# Patient Record
Sex: Female | Born: 1965 | Race: White | Hispanic: No | Marital: Married | Smoking: Never smoker
Health system: Southern US, Community
[De-identification: ages and names within clinical notes are randomized; demographics above are authoritative.]

## PROBLEM LIST (undated history)

## (undated) DIAGNOSIS — F329 Major depressive disorder, single episode, unspecified: Secondary | ICD-10-CM

## (undated) DIAGNOSIS — D510 Vitamin B12 deficiency anemia due to intrinsic factor deficiency: Secondary | ICD-10-CM

## (undated) DIAGNOSIS — D649 Anemia, unspecified: Secondary | ICD-10-CM

## (undated) DIAGNOSIS — F419 Anxiety disorder, unspecified: Secondary | ICD-10-CM

## (undated) DIAGNOSIS — F32A Depression, unspecified: Secondary | ICD-10-CM

## (undated) DIAGNOSIS — E785 Hyperlipidemia, unspecified: Secondary | ICD-10-CM

## (undated) DIAGNOSIS — F5104 Psychophysiologic insomnia: Secondary | ICD-10-CM

## (undated) DIAGNOSIS — N159 Renal tubulo-interstitial disease, unspecified: Secondary | ICD-10-CM

## (undated) HISTORY — DX: Hyperlipidemia, unspecified: E78.5

## (undated) HISTORY — DX: Anemia, unspecified: D64.9

## (undated) HISTORY — DX: Depression, unspecified: F32.A

## (undated) HISTORY — DX: Major depressive disorder, single episode, unspecified: F32.9

## (undated) HISTORY — DX: Renal tubulo-interstitial disease, unspecified: N15.9

## (undated) HISTORY — DX: Psychophysiologic insomnia: F51.04

## (undated) HISTORY — PX: WISDOM TOOTH EXTRACTION: SHX21

## (undated) HISTORY — DX: Anxiety disorder, unspecified: F41.9

## (undated) HISTORY — DX: Vitamin B12 deficiency anemia due to intrinsic factor deficiency: D51.0

---

## 1998-08-23 HISTORY — PX: AUGMENTATION MAMMAPLASTY: SUR837

## 2005-01-08 ENCOUNTER — Ambulatory Visit: Payer: Self-pay | Admitting: Unknown Physician Specialty

## 2006-03-08 ENCOUNTER — Ambulatory Visit: Payer: Self-pay | Admitting: Unknown Physician Specialty

## 2006-04-09 ENCOUNTER — Ambulatory Visit: Payer: Self-pay | Admitting: Family Medicine

## 2007-05-26 ENCOUNTER — Ambulatory Visit: Payer: Self-pay | Admitting: Unknown Physician Specialty

## 2008-05-27 ENCOUNTER — Ambulatory Visit: Payer: Self-pay | Admitting: Unknown Physician Specialty

## 2009-01-21 HISTORY — PX: THYROGLOSSAL DUCT CYST: SHX297

## 2009-02-12 ENCOUNTER — Emergency Department: Payer: Self-pay | Admitting: Emergency Medicine

## 2009-02-12 ENCOUNTER — Ambulatory Visit: Payer: Self-pay | Admitting: Otolaryngology

## 2009-02-13 ENCOUNTER — Ambulatory Visit: Payer: Self-pay | Admitting: Otolaryngology

## 2009-02-19 ENCOUNTER — Ambulatory Visit: Payer: Self-pay | Admitting: Otolaryngology

## 2009-05-30 ENCOUNTER — Ambulatory Visit: Payer: Self-pay | Admitting: Unknown Physician Specialty

## 2010-06-18 ENCOUNTER — Ambulatory Visit: Payer: Self-pay | Admitting: Unknown Physician Specialty

## 2010-11-03 ENCOUNTER — Emergency Department: Payer: Self-pay | Admitting: Emergency Medicine

## 2010-11-11 ENCOUNTER — Ambulatory Visit: Payer: Self-pay | Admitting: Internal Medicine

## 2011-07-20 ENCOUNTER — Ambulatory Visit: Payer: Self-pay | Admitting: Unknown Physician Specialty

## 2012-02-20 IMAGING — CR DG CHEST 2V
1 series · 2 of 2 positions shown · non-contrast
Comparison: none

REASON FOR EXAM: cp cough sob
COMMENTS:

[Series 1: view not recorded · 0.17mm/px · 2 of 2 slices shown]
[im 1/2]
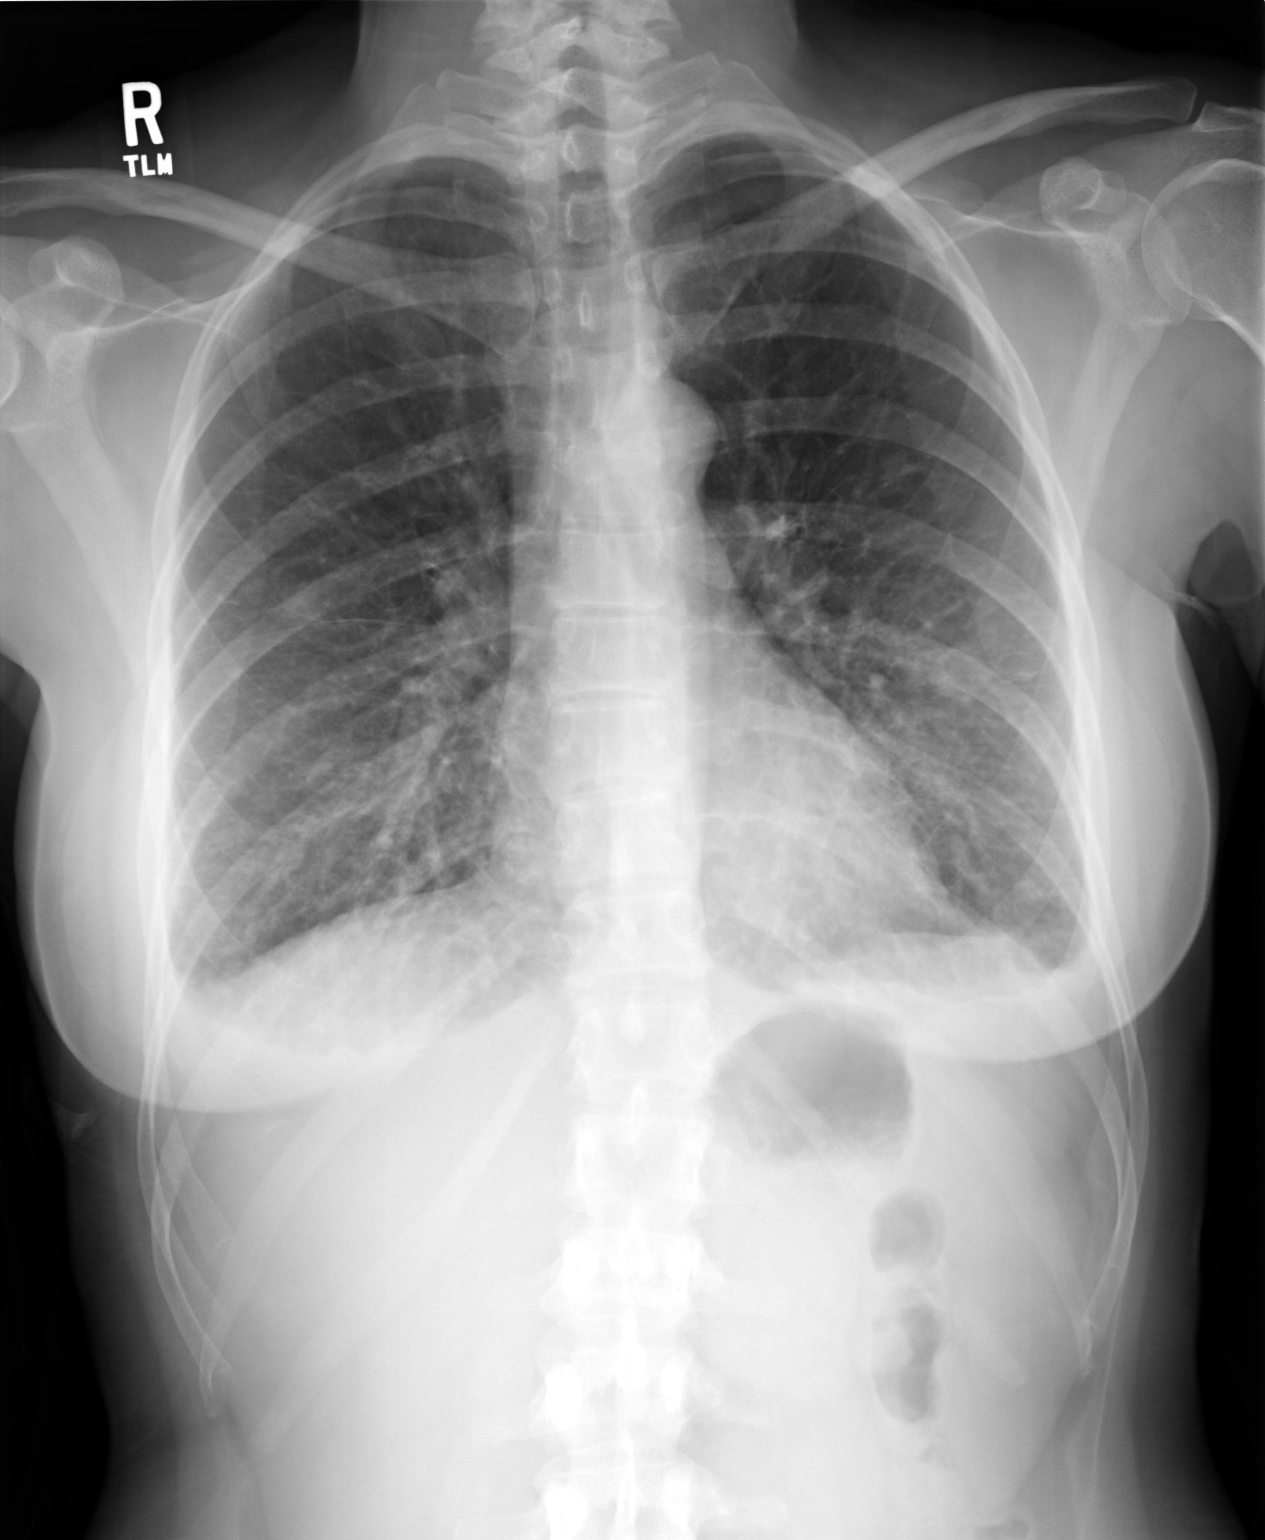
[im 2/2]
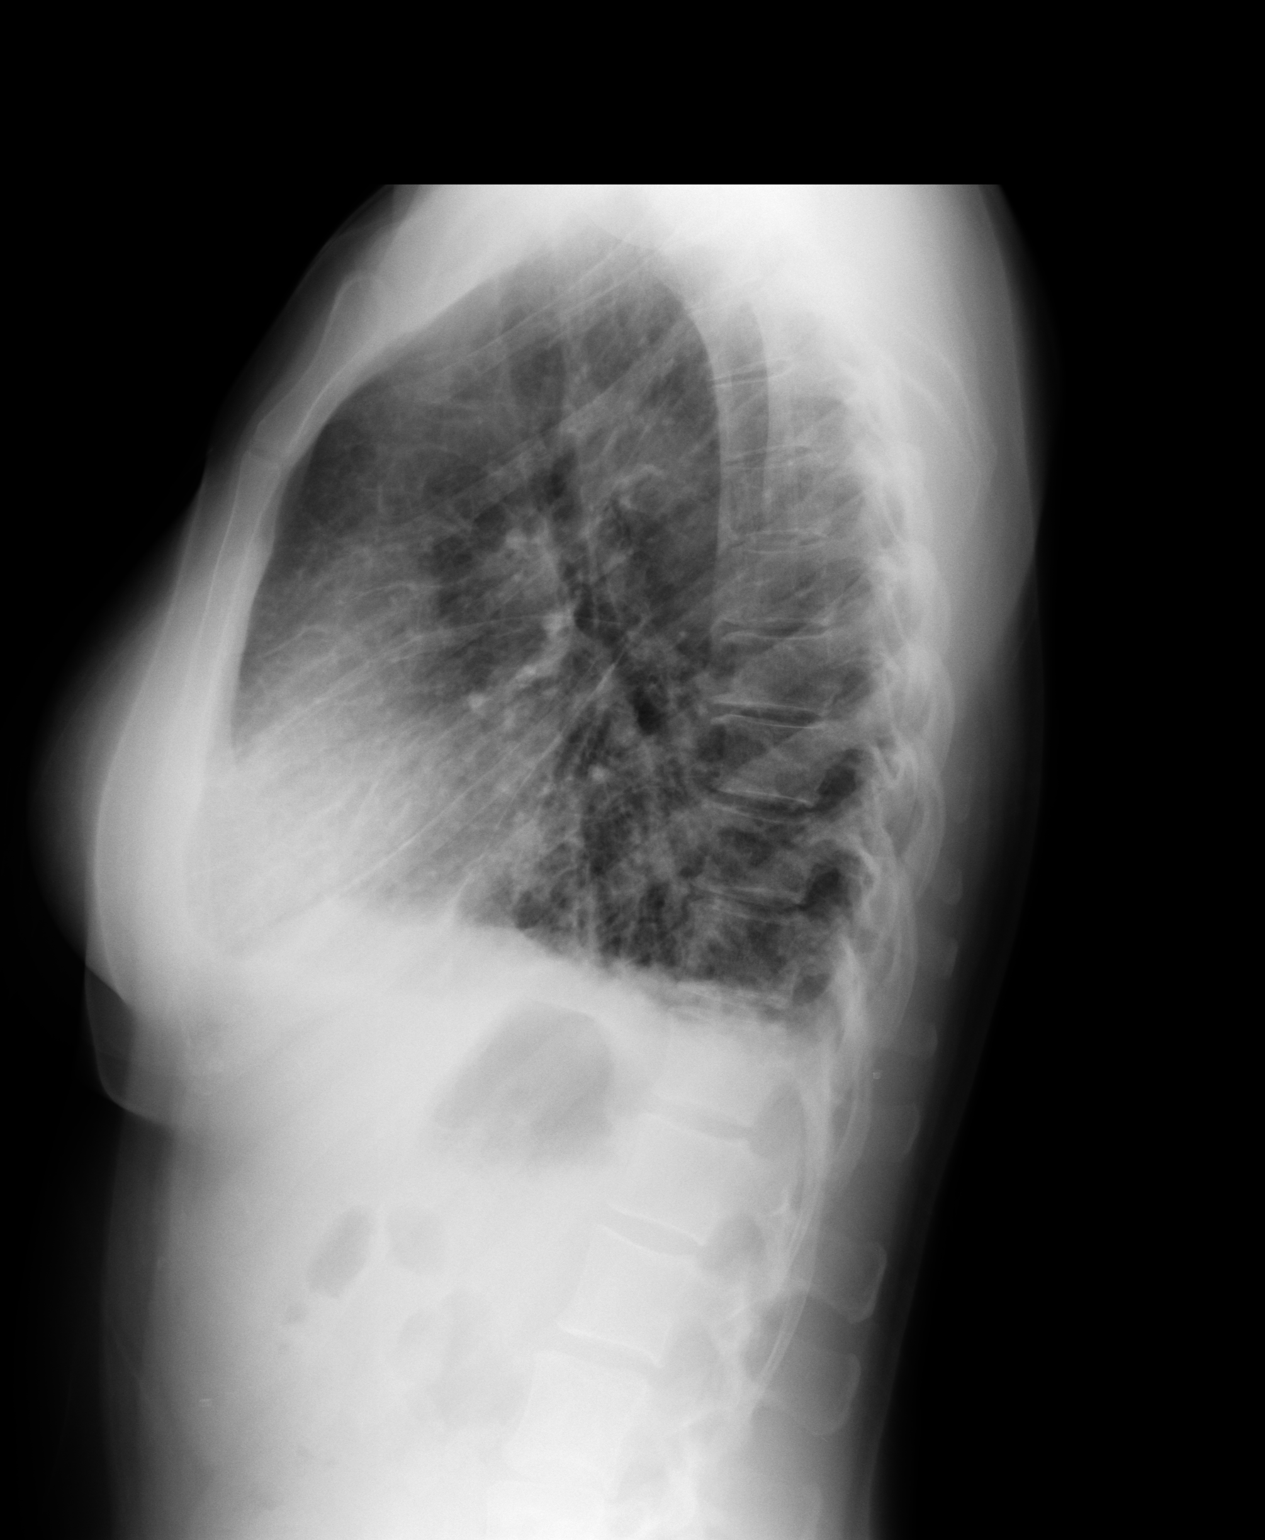

[2 of 2 positions shown; findings below may reference images not displayed]

PROCEDURE:     DXR - DXR CHEST PA (OR AP) AND LATERAL  - November 03, 2010  [DATE]

RESULT:     There is blunting of both costophrenic angles compatible with
trace bibasilar pleural effusions. There is thickening of the basilar lung
markings bilaterally compatible with either interstitial edema or
atelectasis. No pneumonia is identified. Heart size is within normal limits.
The chest appears mildly hyperexpanded which suggest a history of COPD or
asthma.
IMPRESSION: 1. There are noted minimal changes suggestive of early or low grade CHF
superimposed on COPD. No definite pneumonia is seen.
2. Small bibasilar pleural effusions are noted.

## 2012-02-28 IMAGING — CT CT CHEST W/ CM
1 series · 15 of 32 positions shown, 19 images · IV contrast (APPLIED)
Comparison: none

REASON FOR EXAM: chest pain abn xray vq scan elevated d dimer  allergy
COMMENTS:

[Series 4: soft tissue · axial · 0.57mm/px · z∈[-760,-496]mm · 15 of 99 slices shown, 19 images]
[im 7/99  soft-tissue]
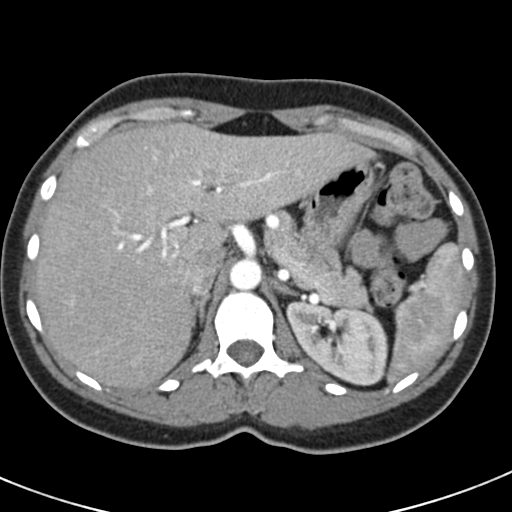
[im 7/99  bone]
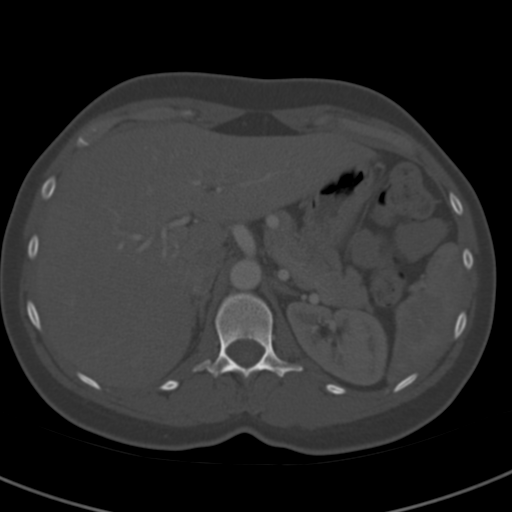
[im 13/99  soft-tissue]
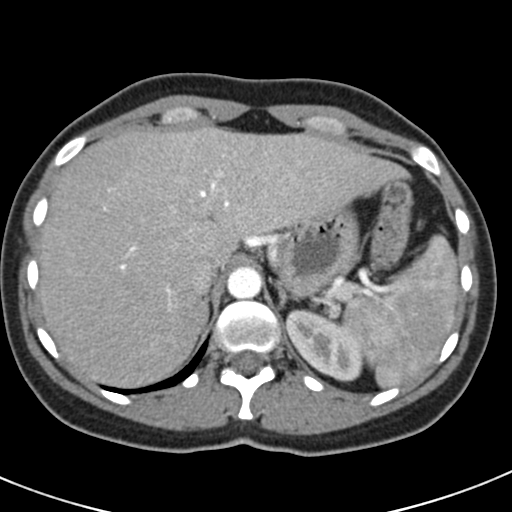
[im 19/99  soft-tissue]
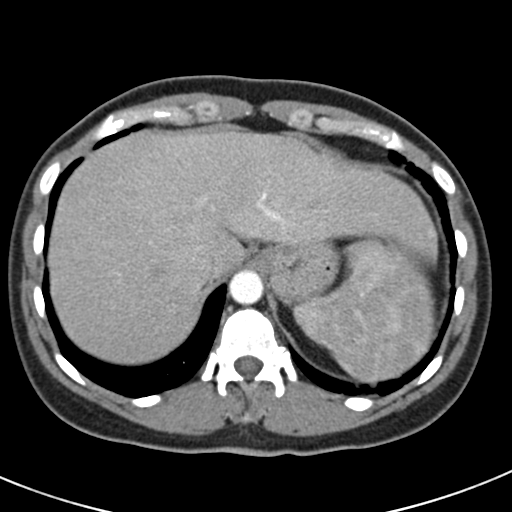
[im 29/99  soft-tissue]
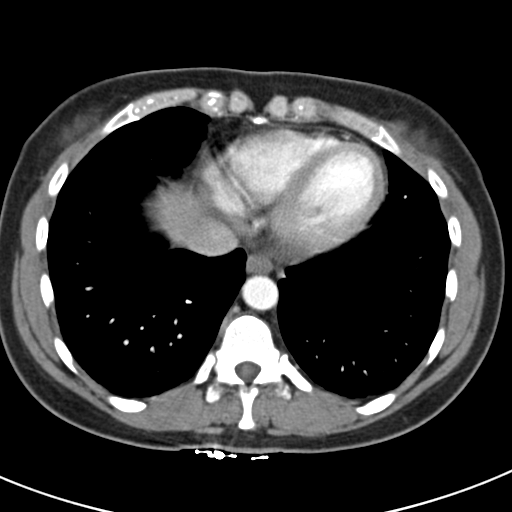
[im 35/99  soft-tissue]
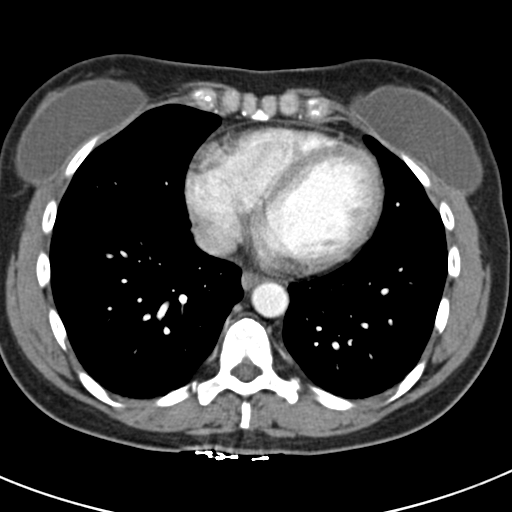
[im 42/99  soft-tissue]
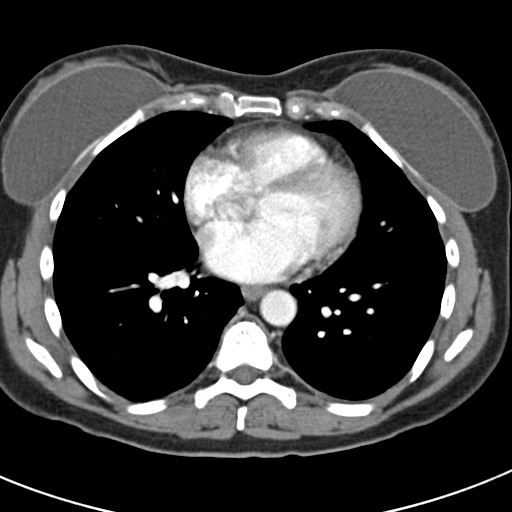
[im 51/99  soft-tissue]
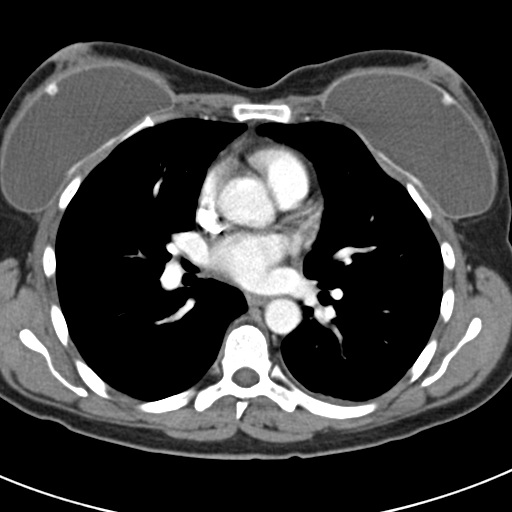
[im 57/99  soft-tissue]
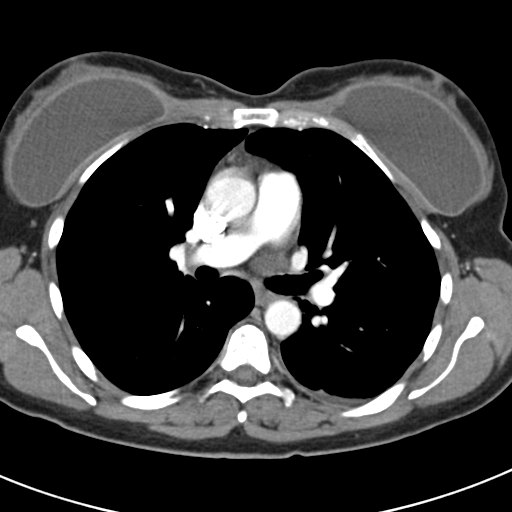
[im 64/99  soft-tissue]
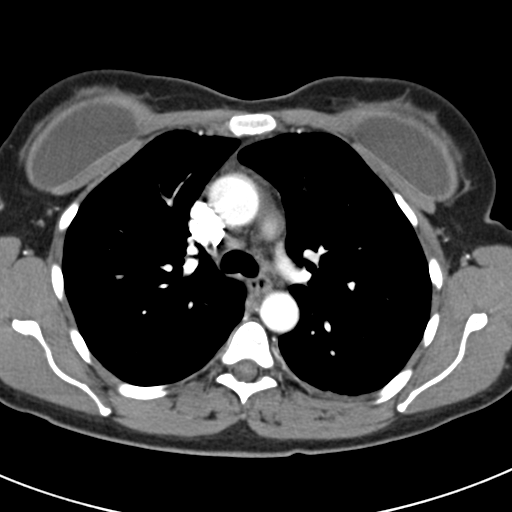
[im 64/99  bone]
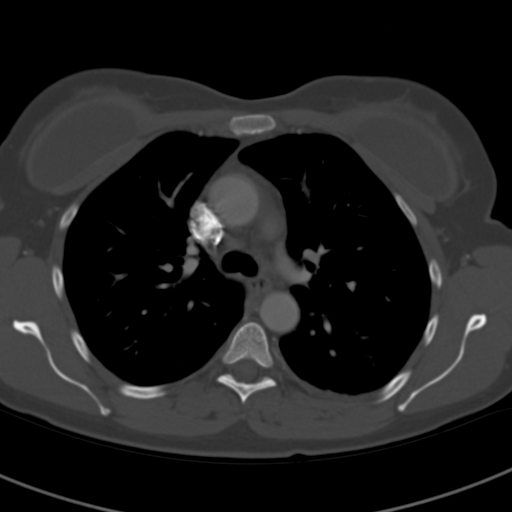
[im 70/99  soft-tissue]
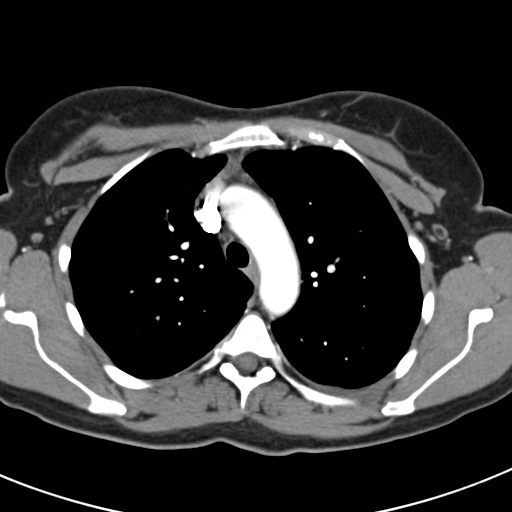
[im 80/99  soft-tissue]
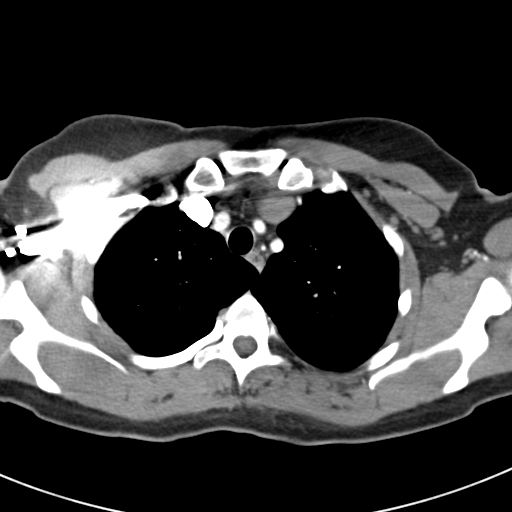
[im 86/99  soft-tissue]
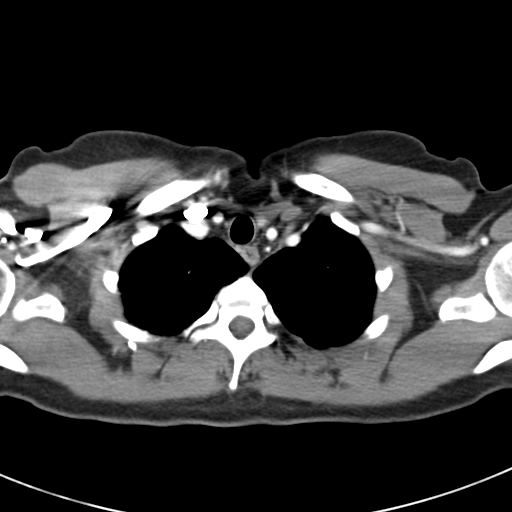
[im 86/99  lung]
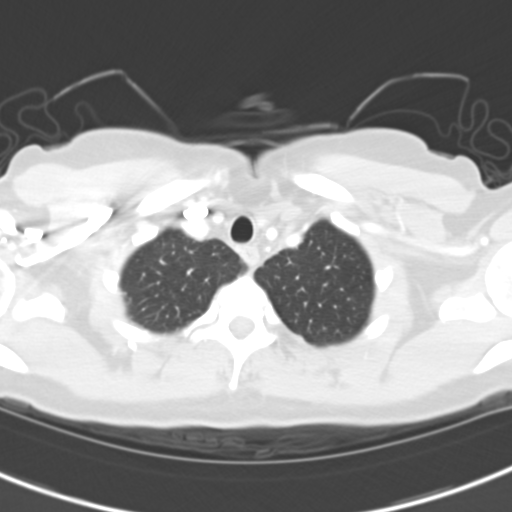
[im 89/99  lung]
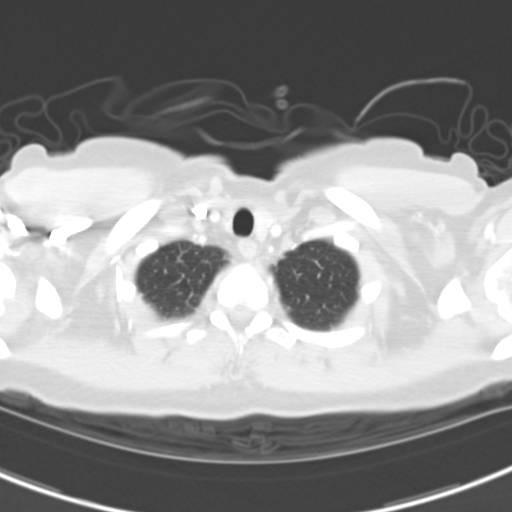
[im 92/99  soft-tissue]
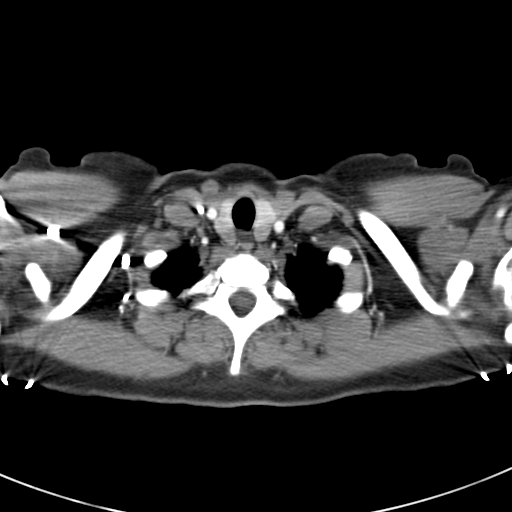
[im 92/99  lung]
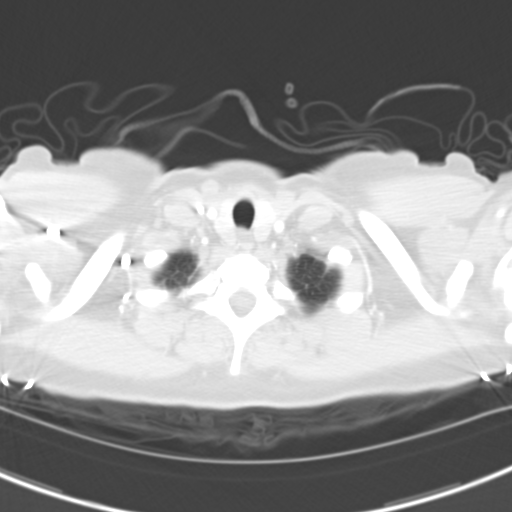
[im 95/99  lung]
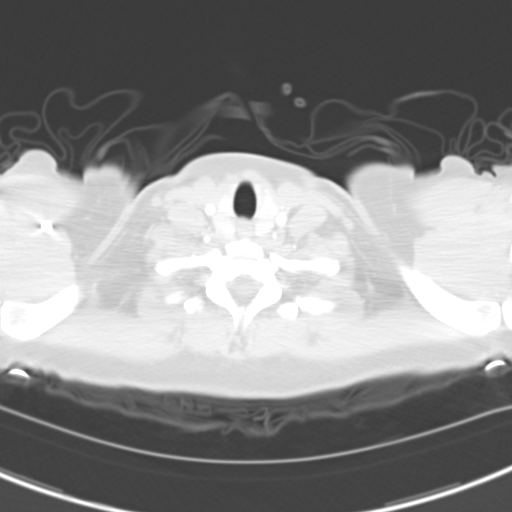

[15 of 32 positions shown; findings below may reference images not displayed]

PROCEDURE:     CT  - CT CHEST WITH CONTRAST  - November 11, 2010  [DATE]

RESULT:     Axial CT scanning was performed through the chest at 3 mm
intervals and slice thicknesses. The patient received 75 cc of [id number]
after undergoing a descensus patient protocol.

The cardiac chambers are normal in size. The caliber of the thoracic aorta
is normal. Contrast within the pulmonary arterial tree is normal in
appearance. I see no filling defects to suggest an acute pulmonary embolism.
There is no pleural nor pericardial effusion. There are bilateral breast
implants in place.

At lung window settings I see no to evidence of pneumonia. There is minimal
atelectasis in the costophrenic gutters on the left. No suspicious pulmonary
parenchymal masses are demonstrated. The thoracic vertebral bodies are
preserved in height. The sternum is normal in appearance. I do not see
evidence of an acute rib lesion.

Within the upper abdomen the observed portions of the liver and spleen
exhibit no acute abnormality. There are no adrenal masses.
IMPRESSION: 1. I do not see evidence of an acute pulmonary embolism.
2. There is no evidence of CHF nor pneumonia. I see no mediastinal nor hilar
mass.
3. The lungs appeared abnormal on the prior chest x-ray 03 November, 2010. On
today's study there is mild emphysematous type change, but I do not see
evidence of interstitial or alveolar infiltrates.

## 2013-09-06 DIAGNOSIS — D510 Vitamin B12 deficiency anemia due to intrinsic factor deficiency: Secondary | ICD-10-CM | POA: Insufficient documentation

## 2013-12-28 DIAGNOSIS — G4700 Insomnia, unspecified: Secondary | ICD-10-CM | POA: Insufficient documentation

## 2013-12-28 DIAGNOSIS — J302 Other seasonal allergic rhinitis: Secondary | ICD-10-CM | POA: Insufficient documentation

## 2013-12-28 DIAGNOSIS — E785 Hyperlipidemia, unspecified: Secondary | ICD-10-CM | POA: Insufficient documentation

## 2013-12-28 DIAGNOSIS — F419 Anxiety disorder, unspecified: Secondary | ICD-10-CM | POA: Insufficient documentation

## 2014-01-01 DIAGNOSIS — M1990 Unspecified osteoarthritis, unspecified site: Secondary | ICD-10-CM | POA: Insufficient documentation

## 2014-05-08 ENCOUNTER — Ambulatory Visit (INDEPENDENT_AMBULATORY_CARE_PROVIDER_SITE_OTHER): Payer: 59 | Admitting: Podiatry

## 2014-05-08 ENCOUNTER — Ambulatory Visit (INDEPENDENT_AMBULATORY_CARE_PROVIDER_SITE_OTHER): Payer: 59

## 2014-05-08 ENCOUNTER — Encounter: Payer: Self-pay | Admitting: Podiatry

## 2014-05-08 VITALS — BP 133/76 | HR 85 | Resp 16 | Ht 64.0 in | Wt 150.0 lb

## 2014-05-08 DIAGNOSIS — M779 Enthesopathy, unspecified: Secondary | ICD-10-CM

## 2014-05-08 DIAGNOSIS — M79609 Pain in unspecified limb: Secondary | ICD-10-CM

## 2014-05-08 MED ORDER — METHYLPREDNISOLONE (PAK) 4 MG PO TABS: ORAL_TABLET | ORAL | Status: DC

## 2014-05-08 MED ORDER — MELOXICAM 15 MG PO TABS
15.0000 mg | ORAL_TABLET | Freq: Every day | ORAL | Status: DC
Start: 2014-05-08 — End: 2014-07-03

## 2014-05-08 NOTE — Progress Notes (Signed)
   Subjective:    Patient ID: Heather Holmes, female    DOB: [birth year], 48 y.o.   MRN: [id number]  HPI Comments: Both of my feet have pain. i have pain in  Both of my 2nd toes and pain under my toes. Ive had the pain for 1 year. Its getting worse. At the end of the day im limping. It hurts to walk. i had x-rays done by dr Lavenia Atlas in march and i was diagnosed with osteoarthritis. i went to see dr troxler in may and he said i could have a hammer toe or a neuroma. i take aleve for pain, wrap my toes and that's it.  Foot Pain      Review of Systems  Musculoskeletal:       Joint pain Difficulty walking   All other systems reviewed and are negative.      Objective:   Physical Exam: I have reviewed her past medical history medications allergies surgeries social history and review of systems. Pulses are strongly palpable bilateral. Neurologic sensorium is intact per Semmes-Weinstein monofilament. Deep tendon reflexes are intact bilateral muscle strength is 5 over 5 dorsiflexors plantar flexors inverters everters all intrinsic musculature is intact. Orthopedic evaluation demonstrates all joints distal to the ankle a full range of motion without crepitation. She has pain on palpation and on in range of motion of the second metatarsophalangeal joints bilaterally. Right appears to be worse than left. Radiographic evaluation demonstrates an elongated second metatarsal no osseous abnormalities other than medial deviation of the second toe but no hammertoe deformity.        Assessment & Plan:  Assessment: Capsulitis second metatarsophalangeal joint of the right foot greater than left. Elongated plantarflexed second metatarsal bilateral.  Plan: Discussed etiology pathology conservative versus surgical therapies. Intra-articular injection of dexamethasone and local anesthetic to the second metatarsophalangeal joint today after sterile Betadine skin prep. Prescription for Sterapred Dosepak to be  followed by meloxicam. Dispensed carbon graphite insoles. Discussed appropriate shoe gear stretching exercises ice therapy and shoe gear modifications.

## 2014-06-05 ENCOUNTER — Ambulatory Visit (INDEPENDENT_AMBULATORY_CARE_PROVIDER_SITE_OTHER): Payer: 59 | Admitting: Podiatry

## 2014-06-05 VITALS — BP 122/73 | HR 87 | Resp 16

## 2014-06-05 DIAGNOSIS — M779 Enthesopathy, unspecified: Secondary | ICD-10-CM

## 2014-06-05 NOTE — Progress Notes (Signed)
She presents today stating that she is approximately 50-60% improved as she refers her capsulitis second metatarsophalangeal joint bilateral.  Objective: She presents today wearing high-heeled shoes. She still has mild tenderness on palpation and in range of motion of the second metatarsophalangeal joint bilateral right greater than left.  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  Plan: Discussed appropriate shoe gear stretching exercises ice therapy shoe gear modifications discussed continue her anti-inflammatory and I will followup with her in the near future for surgical evaluation if necessary.

## 2014-07-03 ENCOUNTER — Ambulatory Visit (INDEPENDENT_AMBULATORY_CARE_PROVIDER_SITE_OTHER): Payer: 59 | Admitting: Podiatry

## 2014-07-03 VITALS — BP 144/82 | HR 81 | Resp 16

## 2014-07-03 DIAGNOSIS — M779 Enthesopathy, unspecified: Secondary | ICD-10-CM

## 2014-07-03 MED ORDER — MELOXICAM 15 MG PO TABS: 15.0000 mg | ORAL_TABLET | Freq: Every day | ORAL | Status: DC

## 2014-07-03 NOTE — Progress Notes (Signed)
She presents today for follow-up of capsulitis second metatarsophalangeal joint bilaterally. She states that they're doing about the same there probably 80% improved from where they were initially. She denies any changes in her past medical history medications allergies surgeries or social history. Denies any trauma to the feet. Continues conservative therapies including anti-inflammatories and graphite insoles.  Objective: Vital signs are stable she is alert and oriented 3. Pulses are palpable bilateral. She has much decreased in edema about the second metatarsophalangeal joint bilateral no pain on end range of motion of the second and third metatarsophalangeal joints bilateral.  Assessment: Slowly resolving capsulitis second metatarsophalangeal joint bilateral.  Plan: Continue all conservative therapies and I will follow-up with her in 6 weeks if needed. May discuss orthotics at that time.

## 2014-08-07 ENCOUNTER — Ambulatory Visit: Payer: 59 | Admitting: Podiatry

## 2014-08-28 ENCOUNTER — Ambulatory Visit: Payer: 59 | Admitting: Podiatry

## 2014-10-16 ENCOUNTER — Ambulatory Visit: Payer: Self-pay | Admitting: Podiatry

## 2014-11-06 ENCOUNTER — Ambulatory Visit: Payer: Self-pay | Admitting: Podiatry

## 2014-12-04 ENCOUNTER — Ambulatory Visit: Payer: Self-pay | Admitting: Podiatry

## 2014-12-25 ENCOUNTER — Ambulatory Visit (INDEPENDENT_AMBULATORY_CARE_PROVIDER_SITE_OTHER): Payer: BLUE CROSS/BLUE SHIELD | Admitting: Podiatry

## 2014-12-25 VITALS — BP 118/80 | HR 90 | Resp 16

## 2014-12-25 DIAGNOSIS — M779 Enthesopathy, unspecified: Secondary | ICD-10-CM

## 2014-12-25 NOTE — Progress Notes (Signed)
She presents today for follow-up of her capsulitis second metatarsophalangeal joints bilaterally. She states that really hasn't changed that much.  Objective: Vital signs are stable alert and oriented 3. Pulses are palpable bilateral. She has mild medial deviation of the second toes at the metatarsophalangeal joint toward the hallux. Mild hallux valgus deformity is noted. No dorsiflexion at the second metatarsophalangeal joint yet. Mild tenderness on forced dorsiflexion and plantarflexion.  Assessment: Capsulitis second metatarsophalangeal joint bilateral.  Plan: History of plantar fasciitis with capsulitis is best to utilize orthotics at this point. This should help prevent chronic capsulitis and eventual rupture of the second metatarsophalangeal joint and hammertoe deformity which would necessitate surgical intervention. She was scanned for orthotics today and I will follow up with her once those come in.

## 2015-01-15 ENCOUNTER — Ambulatory Visit (INDEPENDENT_AMBULATORY_CARE_PROVIDER_SITE_OTHER): Payer: BLUE CROSS/BLUE SHIELD | Admitting: Podiatry

## 2015-01-15 ENCOUNTER — Encounter: Payer: Self-pay | Admitting: Podiatry

## 2015-01-15 VITALS — BP 118/80 | HR 90 | Resp 16

## 2015-01-15 DIAGNOSIS — M779 Enthesopathy, unspecified: Secondary | ICD-10-CM | POA: Diagnosis not present

## 2015-01-15 MED ORDER — METHYLPREDNISOLONE 4 MG PO TBPK: ORAL_TABLET | ORAL | Status: DC

## 2015-01-15 NOTE — Patient Instructions (Signed)

## 2015-01-15 NOTE — Progress Notes (Signed)
She presents today to pick up orthotics. She states that she's noticed a definite negative change in her second metatarsophalangeal joints with medial deviation of the toe right greater than left. States this seems to be getting worse. She is requesting an injection today. States she is getting ready to go on a cruise area  Objective: Vital signs are stable she is alert and oriented 3. Pulses are palpable. Orthotics were dispensed.  Assessment: Capsulitis second metatarsophalangeal joints bilateral.  Plan: I encouraged her to wear these orthotics for the next few weeks I will follow-up with her before she goes on her cruise. Dispensed a prescription for methylprednisolone. She will call with questions or concerns otherwise I'll see her at that time more than likely for a periarticular injection and possibly to discuss surgical intervention if her symptoms have not resolved.

## 2015-02-05 ENCOUNTER — Ambulatory Visit (INDEPENDENT_AMBULATORY_CARE_PROVIDER_SITE_OTHER): Payer: BLUE CROSS/BLUE SHIELD | Admitting: Podiatry

## 2015-02-05 VITALS — BP 142/90 | HR 79 | Resp 16

## 2015-02-05 DIAGNOSIS — M779 Enthesopathy, unspecified: Secondary | ICD-10-CM | POA: Diagnosis not present

## 2015-02-05 MED ORDER — METHYLPREDNISOLONE 4 MG PO TBPK: ORAL_TABLET | ORAL | Status: DC

## 2015-02-05 MED ORDER — DICLOFENAC SODIUM 1 % TD GEL: 4.0000 g | Freq: Four times a day (QID) | TRANSDERMAL | Status: DC

## 2015-02-06 NOTE — Progress Notes (Addendum)
She presents today for follow-up of her capsulitis second metatarsophalangeal joint bilaterally. She states that the orthotics are bothering me and I think a few getting worse.  Objective: Vital signs are stable she's alert and oriented 3. She has pain on palpation and range of motion of the second metatarsophalangeal joint bilaterally. Evaluating the orthotics the arches appear to be a bit high and the leather is coming off of the orthotics.  Assessment: capsulitis second metatarsophalangeal joint bilateral.  Plan: Discussed etiology pathology conservative versus surgical therapy is discussed need for surgical intervention. I reinjected the joint periarticular today

## 2015-03-10 ENCOUNTER — Ambulatory Visit: Payer: BLUE CROSS/BLUE SHIELD | Admitting: Podiatry

## 2015-03-24 ENCOUNTER — Ambulatory Visit: Payer: BLUE CROSS/BLUE SHIELD | Admitting: Podiatry

## 2015-05-07 ENCOUNTER — Ambulatory Visit: Payer: BLUE CROSS/BLUE SHIELD | Admitting: Podiatry

## 2015-06-09 ENCOUNTER — Ambulatory Visit: Payer: BLUE CROSS/BLUE SHIELD | Admitting: Podiatry

## 2015-06-25 ENCOUNTER — Ambulatory Visit: Payer: BLUE CROSS/BLUE SHIELD | Admitting: Podiatry

## 2015-07-23 ENCOUNTER — Ambulatory Visit: Payer: BLUE CROSS/BLUE SHIELD | Admitting: Podiatry

## 2015-09-10 ENCOUNTER — Ambulatory Visit: Payer: BLUE CROSS/BLUE SHIELD | Admitting: Podiatry

## 2015-09-24 ENCOUNTER — Encounter: Payer: Self-pay | Admitting: Podiatry

## 2015-09-24 ENCOUNTER — Ambulatory Visit (INDEPENDENT_AMBULATORY_CARE_PROVIDER_SITE_OTHER): Payer: BLUE CROSS/BLUE SHIELD

## 2015-09-24 ENCOUNTER — Ambulatory Visit (INDEPENDENT_AMBULATORY_CARE_PROVIDER_SITE_OTHER): Payer: BLUE CROSS/BLUE SHIELD | Admitting: Podiatry

## 2015-09-24 VITALS — BP 112/62 | HR 91 | Resp 16

## 2015-09-24 DIAGNOSIS — M779 Enthesopathy, unspecified: Secondary | ICD-10-CM

## 2015-09-24 DIAGNOSIS — M7661 Achilles tendinitis, right leg: Secondary | ICD-10-CM

## 2015-09-24 DIAGNOSIS — M722 Plantar fascial fibromatosis: Secondary | ICD-10-CM | POA: Diagnosis not present

## 2015-09-24 DIAGNOSIS — M7662 Achilles tendinitis, left leg: Secondary | ICD-10-CM

## 2015-09-24 MED ORDER — METHYLPREDNISOLONE 4 MG PO TBPK: ORAL_TABLET | ORAL | Status: DC

## 2015-09-24 MED ORDER — DICLOFENAC SODIUM 75 MG PO TBEC: 75.0000 mg | DELAYED_RELEASE_TABLET | Freq: Two times a day (BID) | ORAL | Status: DC

## 2015-09-24 NOTE — Patient Instructions (Signed)

## 2015-09-24 NOTE — Progress Notes (Signed)
She presents today for follow-up of capsulitis second metatarsophalangeal joint bilaterally. She states that really haven't noticed a lot of change here however I have experienced considerable soreness on the bottom and backs of both heels.  Objective: Vital signs are stable she is alert and oriented 3. Medial deviation of the second toes with pain on end range of motion at the metatarsophalangeal joint is suggestive of pre-dislocation syndrome. She has pain on palpation medial calcaneal tubercle bilateral. Pulses are palpable pain. Radiographs demonstrate thickening of the Achilles tendon posterior aspect as it inserts on the calcaneus. There is also thickening of the plantar fascia at its insertion site. 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  Assessment: Plantar fasciitis bilateral capsulitis second metatarsophalangeal joint bilateral.  Plan: Started her on a Medrol Dosepak to be followed by diclofenac. I injected the bilateral heels today with Kenalog and local anesthetic discussed appropriate shoe year stretching exercises and ice therapy. Placed her implant fracture brace and a night splint. Follow up with her in 1 month to discuss possible surgery to the second metatarsophalangeal joint.

## 2015-10-22 ENCOUNTER — Ambulatory Visit: Payer: BLUE CROSS/BLUE SHIELD | Admitting: Podiatry

## 2015-11-12 ENCOUNTER — Ambulatory Visit (INDEPENDENT_AMBULATORY_CARE_PROVIDER_SITE_OTHER): Payer: BLUE CROSS/BLUE SHIELD | Admitting: Podiatry

## 2015-11-12 ENCOUNTER — Encounter: Payer: Self-pay | Admitting: Podiatry

## 2015-11-12 VITALS — BP 119/78 | HR 92 | Resp 16

## 2015-11-12 DIAGNOSIS — M722 Plantar fascial fibromatosis: Secondary | ICD-10-CM

## 2015-11-12 NOTE — Patient Instructions (Signed)
Pre-Operative Instructions  Congratulations, you have decided to take an important step to improving your quality of life.  You can be assured that the doctors of [facility] will be with you every step of the way.  1. Plan to be at the surgery center/hospital at least 1 (one) hour prior to your scheduled time unless otherwise directed by the surgical center/hospital staff.  You must have a responsible adult accompany you, remain during the surgery and drive you home.  Make sure you have directions to the surgical center/hospital and know how to get there on time. 2. For hospital based surgery you will need to obtain a history and physical form from your family physician within 1 month prior to the date of surgery- we will give you a form for you primary physician.  3. We make every effort to accommodate the date you request for surgery.  There are however, times where surgery dates or times have to be moved.  We will contact you as soon as possible if a change in schedule is required.   4. No Aspirin/Ibuprofen for one week before surgery.  If you are on aspirin, any non-steroidal anti-inflammatory medications (Mobic, Aleve, Ibuprofen) you should stop taking it 7 days prior to your surgery.  You make take Tylenol  For pain prior to surgery.  5. Medications- If you are taking daily heart and blood pressure medications, seizure, reflux, allergy, asthma, anxiety, pain or diabetes medications, make sure the surgery center/hospital is aware before the day of surgery so they may notify you which medications to take or avoid the day of surgery. 6. No food or drink after midnight the night before surgery unless directed otherwise by surgical center/hospital staff. 7. No alcoholic beverages 24 hours prior to surgery.  No smoking 24 hours prior to or 24 hours after surgery. 8. Wear loose pants or shorts- loose enough to fit over bandages, boots, and casts. 9. No slip on shoes, sneakers are best. 10. Bring  your boot with you to the surgery center/hospital.  Also bring crutches or a walker if your physician has prescribed it for you.  If you do not have this equipment, it will be provided for you after surgery. 11. If you have not been contracted by the surgery center/hospital by the day before your surgery, call to confirm the date and time of your surgery. 12. Leave-time from work may vary depending on the type of surgery you have.  Appropriate arrangements should be made prior to surgery with your employer. 13. Prescriptions will be provided immediately following surgery by your doctor.  Have these filled as soon as possible after surgery and take the medication as directed. 14. Remove nail polish on the operative foot. 15. Wash the night before surgery.  The night before surgery wash the foot and leg well with the antibacterial soap provided and water paying special attention to beneath the toenails and in between the toes.  Rinse thoroughly with water and dry well with a towel.  Perform this wash unless told not to do so by your physician.  Enclosed: 1 Ice pack (please put in freezer the night before surgery)   1 Hibiclens skin cleaner   Pre-op Instructions  If you have any questions regarding the instructions, do not hesitate to call our office.  [place]: [address] , [place] [address] [phone]  [place]: [address] [place], [place] [address] [phone]  [place]: [address]  [place], [place] [address] [phone]  Dr. Richard   Tuchman DPM, Dr. Norman Regal DPM Dr. Richard Sikora DPM, Dr. M. Todd Hyatt DPM, Dr. Kathryn Egerton DPM 

## 2015-11-12 NOTE — Progress Notes (Signed)
She presents today and would like to consider surgical intervention regarding her second metatarsophalangeal joints bilaterally. She states that she is still having significant heel pain right about is much better than it has been previously. She denies changes in her past medical history medications allergies surgeries and social history.  Objective: Vital signs stable alert and oriented 3 at have reviewed her past medical history medications allergies surgeries social history and review of systems. Pulses are strongly palpable. She still has pain on palpation medial calcaneal tubercle of the right heel over the left. She still has pain on palpation and range of motion of the second metatarsophalangeal joint with medial deviation of the toe toward the hallux. No open lesions or wounds are noted.  Assessment: Capsulitis second metatarsophalangeal joint right foot greater than left. Plantar fasciitis right foot greater than left.  Plan: We discussed the etiology pathology conservative versus surgical therapies. At this point she would like to consider surgical intervention regarding a second metatarsal osteotomy with double screw fixation right foot and an EPF right foot. We consented her for that today and I answered all of her questions line by line number by number giving her ample time to ask questions she saw fit regarding these procedures. I answered them to the best of my ability in layman's terms. We did discuss the possible postop complications which may include but are not limited to postop pain bleeding swelling infection recurrence need for further surgery worsening of the condition. We dispensed a cam walker for her postop recovery.. And I will follow-up with her at that time.

## 2015-11-28 ENCOUNTER — Telehealth: Payer: Self-pay | Admitting: *Deleted

## 2015-11-28 NOTE — Telephone Encounter (Signed)
"  I'm scheduled for surgery on April 21.  I'd like to get a call about what the cost will be."

## 2015-12-04 ENCOUNTER — Telehealth: Payer: Self-pay | Admitting: *Deleted

## 2015-12-04 NOTE — Telephone Encounter (Signed)
"  I'm scheduled for surgery for April 21.  I need to cancel.  I'd like to reschedule it but I can't at this time.  I'll call back to reschedule it at some time."  Okay, I'll call and cancel it with the surgical center and inform Dr. Al CorpusHyatt.  I called and left a message for Ar[facility]yn[facility].  All appointments for follow-up for surgery were canceled.

## 2015-12-17 ENCOUNTER — Encounter: Payer: Self-pay | Admitting: Podiatry

## 2015-12-24 ENCOUNTER — Encounter: Payer: Self-pay | Admitting: Podiatry

## 2017-01-18 ENCOUNTER — Other Ambulatory Visit: Payer: Self-pay | Admitting: Internal Medicine

## 2017-01-18 DIAGNOSIS — Z1231 Encounter for screening mammogram for malignant neoplasm of breast: Secondary | ICD-10-CM

## 2017-02-25 ENCOUNTER — Ambulatory Visit
Admission: RE | Admit: 2017-02-25 | Discharge: 2017-02-25 | Disposition: A | Discharge status: Home / Self Care | Payer: Managed Care, Other (non HMO) | Source: Ambulatory Visit | Attending: Internal Medicine | Admitting: Internal Medicine

## 2017-02-25 ENCOUNTER — Encounter: Payer: Self-pay | Admitting: Certified Nurse Midwife

## 2017-02-25 ENCOUNTER — Ambulatory Visit (INDEPENDENT_AMBULATORY_CARE_PROVIDER_SITE_OTHER): Payer: Managed Care, Other (non HMO) | Admitting: Certified Nurse Midwife

## 2017-02-25 VITALS — BP 118/66 | HR 73 | Ht 64.0 in | Wt 158.0 lb

## 2017-02-25 DIAGNOSIS — Z1231 Encounter for screening mammogram for malignant neoplasm of breast: Secondary | ICD-10-CM | POA: Insufficient documentation

## 2017-02-25 DIAGNOSIS — Z01419 Encounter for gynecological examination (general) (routine) without abnormal findings: Secondary | ICD-10-CM | POA: Diagnosis not present

## 2017-02-25 DIAGNOSIS — Z124 Encounter for screening for malignant neoplasm of cervix: Secondary | ICD-10-CM | POA: Diagnosis not present

## 2017-02-25 NOTE — Patient Instructions (Addendum)
Health Maintenance for Postmenopausal Women Menopause is a normal process in which your reproductive ability comes to an end. This process happens gradually over a span of months to years, usually between the ages of 22 and 9. Menopause is complete when you have missed 12 consecutive menstrual periods. It is important to talk with your health care provider about some of the most common conditions that affect postmenopausal women, such as heart disease, cancer, and bone loss (osteoporosis). Adopting a healthy lifestyle and getting preventive care can help to promote your health and wellness. Those actions can also lower your chances of developing some of these common conditions. What should I know about menopause? During menopause, you may experience a number of symptoms, such as:  Moderate-to-severe hot flashes.  Night sweats.  Decrease in sex drive.  Mood swings.  Headaches.  Tiredness.  Irritability.  Memory problems.  Insomnia.  Choosing to treat or not to treat menopausal changes is an individual decision that you make with your health care provider. What should I know about hormone replacement therapy and supplements? Hormone therapy products are effective for treating symptoms that are associated with menopause, such as hot flashes and night sweats. Hormone replacement carries certain risks, especially as you become older. If you are thinking about using estrogen or estrogen with progestin treatments, discuss the benefits and risks with your health care provider. What should I know about heart disease and stroke? Heart disease, heart attack, and stroke become more likely as you age. This may be due, in part, to the hormonal changes that your body experiences during menopause. These can affect how your body processes dietary fats, triglycerides, and cholesterol. Heart attack and stroke are both medical emergencies. There are many things that you can do to help prevent heart disease  and stroke:  Have your blood pressure checked at least every 1-2 years. High blood pressure causes heart disease and increases the risk of stroke.  If you are 53-22 years old, ask your health care provider if you should take aspirin to prevent a heart attack or a stroke.  Do not use any tobacco products, including cigarettes, chewing tobacco, or electronic cigarettes. If you need help quitting, ask your health care provider.  It is important to eat a healthy diet and maintain a healthy weight. ? Be sure to include plenty of vegetables, fruits, low-fat dairy products, and lean protein. ? Avoid eating foods that are high in solid fats, added sugars, or salt (sodium).  Get regular exercise. This is one of the most important things that you can do for your health. ? Try to exercise for at least 150 minutes each week. The type of exercise that you do should increase your heart rate and make you sweat. This is known as moderate-intensity exercise. ? Try to do strengthening exercises at least twice each week. Do these in addition to the moderate-intensity exercise.  Know your numbers.Ask your health care provider to check your cholesterol and your blood glucose. Continue to have your blood tested as directed by your health care provider.  What should I know about cancer screening? There are several types of cancer. Take the following steps to reduce your risk and to catch any cancer development as early as possible. Breast Cancer  Practice breast self-awareness. ? This means understanding how your breasts normally appear and feel. ? It also means doing regular breast self-exams. Let your health care provider know about any changes, no matter how small.  If you are 40  or older, have a clinician do a breast exam (clinical breast exam or CBE) every year. Depending on your age, family history, and medical history, it may be recommended that you also have a yearly breast X-ray (mammogram).  If you  have a family history of breast cancer, talk with your health care provider about genetic screening.  If you are at high risk for breast cancer, talk with your health care provider about having an MRI and a mammogram every year.  Breast cancer (BRCA) gene test is recommended for women who have family members with BRCA-related cancers. Results of the assessment will determine the need for genetic counseling and BRCA1 and for BRCA2 testing. BRCA-related cancers include these types: ? Breast. This occurs in males or females. ? Ovarian. ? Tubal. This may also be called fallopian tube cancer. ? Cancer of the abdominal or pelvic lining (peritoneal cancer). ? Prostate. ? Pancreatic.  Cervical, Uterine, and Ovarian Cancer Your health care provider may recommend that you be screened regularly for cancer of the pelvic organs. These include your ovaries, uterus, and vagina. This screening involves a pelvic exam, which includes checking for microscopic changes to the surface of your cervix (Pap test).  For women ages 21-65, health care providers may recommend a pelvic exam and a Pap test every three years. For women ages 79-65, they may recommend the Pap test and pelvic exam, combined with testing for human papilloma virus (HPV), every five years. Some types of HPV increase your risk of cervical cancer. Testing for HPV may also be done on women of any age who have unclear Pap test results.  Other health care providers may not recommend any screening for nonpregnant women who are considered low risk for pelvic cancer and have no symptoms. Ask your health care provider if a screening pelvic exam is right for you.  If you have had past treatment for cervical cancer or a condition that could lead to cancer, you need Pap tests and screening for cancer for at least 20 years after your treatment. If Pap tests have been discontinued for you, your risk factors (such as having a new sexual partner) need to be  reassessed to determine if you should start having screenings again. Some women have medical problems that increase the chance of getting cervical cancer. In these cases, your health care provider may recommend that you have screening and Pap tests more often.  If you have a family history of uterine cancer or ovarian cancer, talk with your health care provider about genetic screening.  If you have vaginal bleeding after reaching menopause, tell your health care provider.  There are currently no reliable tests available to screen for ovarian cancer.  Lung Cancer Lung cancer screening is recommended for adults 69-62 years old who are at high risk for lung cancer because of a history of smoking. A yearly low-dose CT scan of the lungs is recommended if you:  Currently smoke.  Have a history of at least 30 pack-years of smoking and you currently smoke or have quit within the past 15 years. A pack-year is smoking an average of one pack of cigarettes per day for one year.  Yearly screening should:  Continue until it has been 15 years since you quit.  Stop if you develop a health problem that would prevent you from having lung cancer treatment.  Colorectal Cancer  This type of cancer can be detected and can often be prevented.  Routine colorectal cancer screening usually begins at  age 42 and continues through age 45.  If you have risk factors for colon cancer, your health care provider may recommend that you be screened at an earlier age.  If you have a family history of colorectal cancer, talk with your health care provider about genetic screening.  Your health care provider may also recommend using home test kits to check for hidden blood in your stool.  A small camera at the end of a tube can be used to examine your colon directly (sigmoidoscopy or colonoscopy). This is done to check for the earliest forms of colorectal cancer.  Direct examination of the colon should be repeated every  5-10 years until age 71. However, if early forms of precancerous polyps or small growths are found or if you have a family history or genetic risk for colorectal cancer, you may need to be screened more often.  Skin Cancer  Check your skin from head to toe regularly.  Monitor any moles. Be sure to tell your health care provider: ? About any new moles or changes in moles, especially if there is a change in a mole's shape or color. ? If you have a mole that is larger than the size of a pencil eraser.  If any of your family members has a history of skin cancer, especially at a young age, talk with your health care provider about genetic screening.  Always use sunscreen. Apply sunscreen liberally and repeatedly throughout the day.  Whenever you are outside, protect yourself by wearing long sleeves, pants, a wide-brimmed hat, and sunglasses.  What should I know about osteoporosis? Osteoporosis is a condition in which bone destruction happens more quickly than new bone creation. After menopause, you may be at an increased risk for osteoporosis. To help prevent osteoporosis or the bone fractures that can happen because of osteoporosis, the following is recommended:  If you are 46-71 years old, get at least 1,000 mg of calcium and at least 600 mg of vitamin D per day.  If you are older than age 55 but younger than age 65, get at least 1,200 mg of calcium and at least 600 mg of vitamin D per day.  If you are older than age 54, get at least 1,200 mg of calcium and at least 800 mg of vitamin D per day.  Smoking and excessive alcohol intake increase the risk of osteoporosis. Eat foods that are rich in calcium and vitamin D, and do weight-bearing exercises several times each week as directed by your health care provider. What should I know about how menopause affects my mental health? Depression may occur at any age, but it is more common as you become older. Common symptoms of depression  include:  Low or sad mood.  Changes in sleep patterns.  Changes in appetite or eating patterns.  Feeling an overall lack of motivation or enjoyment of activities that you previously enjoyed.  Frequent crying spells.  Talk with your health care provider if you think that you are experiencing depression. What should I know about immunizations? It is important that you get and maintain your immunizations. These include:  Tetanus, diphtheria, and pertussis (Tdap) booster vaccine.  Influenza every year before the flu season begins.  Pneumonia vaccine.  Shingles vaccine.  Your health care provider may also recommend other immunizations. This information is not intended to replace advice given to you by your health care provider. Make sure you discuss any questions you have with your health care provider. Document Released: 10/01/2005  Document Revised: 02/27/2016 Document Reviewed: 05/13/2015 Elsevier Interactive Patient Education  2018 Elsevier Inc[employer] Preventing Osteoporosis, Adult Osteoporosis is a condition that causes the bones to get weaker. With osteoporosis, the bones become thinner, and the normal spaces in bone tissue become larger. This can make the bones weak and cause them to break more easily. People who have osteoporosis are more likely to break their wrist, spine, or hip. Even a minor accident or injury can be enough to break weak bones. Osteoporosis can occur with aging. Your body constantly replaces old bone tissue with new tissue. As you get older, you may lose bone tissue more quickly, or it may be replaced more slowly. Osteoporosis is more likely to develop if you have poor nutrition or do not get enough calcium or vitamin D. Other lifestyle factors can also play a role. By making some diet and lifestyle changes, you can help to keep your bones healthy and help to prevent osteoporosis. What nutrition changes can be made? Nutrition plays an important role in maintaining  healthy, strong bones.  Make sure you get enough calcium every day from food or from calcium supplements. ? If you are age 50 or younger, aim to get 1,000 mg of calcium every day. ? If you are older than age 50, aim to get 1,200 mg of calcium every day.  Try to get enough vitamin D every day. ? If you are age 70 or younger, aim to get 600 international units (IU) every day. ? If you are older than age 70, aim to get 800 international units every day.  Follow a healthy diet. Eat plenty of foods that contain calcium and vitamin D. ? Calcium is in milk, cheese, yogurt, and other dairy products. Some fish and vegetables are also good sources of calcium. Many foods such as cereals and breads have had calcium added to them (are fortified). Check nutrition labels to see how much calcium is in a food or drink. ? Foods that contain vitamin D include milk, cereals, salmon, and tuna. Your body also makes vitamin D when you are out in the sun. Bare skin exposure to the sun on your face, arms, legs, or back for no more than 30 minutes a day, 2 times per week is more than enough. Beyond that, it is important to use sunblock to protect your skin from sunburn, which increases your risk for skin cancer.  What lifestyle changes can be made? Making changes in your everyday life can also play an important role in preventing osteoporosis.  Stay active and get exercise every day. Ask your health care provider what types of exercise are best for you.  Do not use any products that contain nicotine or tobacco, such as cigarettes and e-cigarettes. If you need help quitting, ask your health care provider.  Limit alcohol intake to no more than 1 drink a day for nonpregnant women and 2 drinks a day for men. One drink equals 12 oz of beer, 5 oz of wine, or 1 oz of hard liquor.  Why are these changes important? Making these nutrition and lifestyle changes can:  Help you develop and maintain healthy, strong  bones.  Prevent loss of bone mass and the problems that are caused by that loss, such as broken bones and delayed healing.  Make you feel better mentally and physically.  What can happen if changes are not made? Problems that can result from osteoporosis can be very serious. These may include:  A higher risk of   broken bones that are painful and do not heal well.  Physical malformations, such as a collapsed spine or a hunched back.  Problems with movement.  Where to find support: If you need help making changes to prevent osteoporosis, talk with your health care provider. You can ask for a referral to a diet and nutrition specialist (dietitian) and a physical therapist. Where to find more information: Learn more about osteoporosis from:  NIH Osteoporosis and Related Bone Diseases National Resource Center: www.niams.nih.gov/health_info/bone/osteoporosis/osteoporosis_ff.asp  U.S. Office on Women's Health: www.womenshealth.gov/publications/our-publications/fact-sheet/osteoporosis.html  National Osteoporosis Foundation: www.nof.org/patients/what-is-osteoporosis/  Summary  Osteoporosis is a condition that causes weak bones that are more likely to break.  Eating a healthy diet and making sure you get enough calcium and vitamin D can help prevent osteoporosis.  Other ways to reduce your risk of osteoporosis include getting regular exercise and avoiding alcohol and products that contain nicotine or tobacco. This information is not intended to replace advice given to you by your health care provider. Make sure you discuss any questions you have with your health care provider. Document Released: 08/24/2015 Document Revised: 04/19/2016 Document Reviewed: 04/19/2016 Elsevier Interactive Patient Education  2018 Elsevier Inc.  

## 2017-02-26 ENCOUNTER — Encounter: Payer: Self-pay | Admitting: Certified Nurse Midwife

## 2017-02-26 NOTE — Progress Notes (Signed)
Gynecology Annual Exam  PCP: Lynnea Ferrier, MD  Chief Complaint:  Chief Complaint  Patient presents with  . Gynecologic Exam    History of Present Illness: Heather Holmes is a 51 y.o. [phone] presents for annual exam. The patient complains of hot flashes and night sweats. She has not had a menses in 1.5 years and is menopausal. Last pap smear: 07/09/15, results were NIL/neg.  The patient is sexually active.  She does not have dyspareunia.  Since her last visit, she has had no significant changes in her health. She has been diagnosed with a UTI and has been prescribed Ciprofloxacin, but has not started the medicine yet.  Her past medical history is remarkable for arthritis, chronic insomnia, breast augmentation surgery, allergic rhinitis and reactive airway.  The patient does perform some self breast exams. Her last mammogram was taday, resultsare pending. Her previous mammogram was 07/09/15 and results were negative.   There is no family history of breast cancer.  There is no family history of ovarian cancer.   The patient denies smoking.  She rarely drinks alcohol.  She denies illegal drug use.  The patient does not exercise. She does not get adequate calcium in her diet.   The patient denies current symptoms of depression.    She had a recent cholesterol screen by her PCP in 2017 and it was borderline.  Review of Systems: Review of Systems  Constitutional: Positive for weight loss (intentional). Negative for chills and fever.  HENT: Negative for congestion, sinus pain and sore throat.   Eyes: Negative for blurred vision and pain.  Respiratory: Negative for hemoptysis, shortness of breath and wheezing.   Cardiovascular: Negative for chest pain, palpitations and leg swelling.  Gastrointestinal: Negative for abdominal pain, blood in stool, diarrhea, heartburn, nausea and vomiting.  Genitourinary: Positive for frequency. Negative for dysuria, hematuria and  urgency.  Musculoskeletal: Positive for back pain and joint pain (and swelling). Negative for myalgias.  Skin: Negative for itching and rash.  Neurological: Negative for dizziness, tingling and headaches.  Endo/Heme/Allergies: Positive for environmental allergies. Negative for polydipsia. Does not bruise/bleed easily.       Negative for hirsutism. Positive for hot flashes   Psychiatric/Behavioral: Negative for depression. The patient has insomnia. The patient is not nervous/anxious.     Past Medical History:  Past Medical History:  Diagnosis Date  . Anemia   . Anxiety   . Depression   . Hyperlipidemia   . Kidney infection     Past Surgical History:  Past Surgical History:  Procedure Laterality Date  . AUGMENTATION MAMMAPLASTY Bilateral 2000  . WISDOM TOOTH EXTRACTION      Family History:  Family History  Problem Relation Age of Onset  . Osteoporosis Mother   . Asthma Father   . COPD Father   . Heart disease Father   . Stroke Father   . Asthma Paternal Grandfather   . Heart disease Paternal Grandfather   . Breast cancer Neg Hx     Social History:  Social History   Social History  . Marital status: Married    Spouse name: Molly Maduro  . Number of children: 3  . Years of education: 73   Occupational History  . accountant    Social History Main Topics  . Smoking status: Never Smoker  . Smokeless tobacco: Never Used  . Alcohol use Yes     Comment: rarely  . Drug use: No  . Sexual activity: Yes  Partners: Male    Birth control/ protection: Post-menopausal   Other Topics Concern  . Not on file   Social History Narrative   Attended College at [employer]    Allergies:  Allergies  Allergen Reactions  . Iodinated Diagnostic Agents Swelling    Tongue swelling  . Metrizamide Swelling    Tongue swelling  . Ropinirole Other (See Comments)    Medications: Prior to Admission medications   Medication Sig Start Date End Date Taking? Authorizing Provider    albuterol (PROVENTIL HFA;VENTOLIN HFA) 108 (90 Base) MCG/ACT inhaler Inhale into the lungs. 06/07/16 06/07/17 Yes [provider]  ciprofloxacin (CIPRO) 500 MG tablet Take by mouth. 02/25/17 03/07/17 Yes [provider]  fluticasone (FLONASE) 50 MCG/ACT nasal spray Place into the nose.   Yes [provider]  loratadine (CLARITIN) 10 MG tablet Take 10 mg by mouth daily as needed for allergies.   Yes [provider]  Naproxen Sodium (ALEVE PO) Take by mouth as needed.   Yes [provider]  predniSONE (DELTASONE) 20 MG tablet Take by mouth. 02/25/17 03/02/17 Yes [provider]  zolpidem (AMBIEN CR) 6.25 MG CR tablet  02/03/17  Yes [provider]    Physical Exam Vitals: BP 118/66   Pulse 73   Ht 5\' 4"  (1.626 m)   Wt 158 lb (71.7 kg)   BMI 27.12 kg/m   General:White female in  NAD, well groomed, appears stated age HEENT: normocephalic, anicteric Neck: no thyroid enlargement, no palpable nodules, no cervical lymphadenopathy  Pulmonary: No increased work of breathing, CTAB Cardiovascular: RRR, without murmur  Breast: s/p breast implants, no tenderness, no palpable nodules or masses, no skin or nipple retraction present, no nipple discharge.  No axillary, infraclavicular or supraclavicular lymphadenopathy. Abdomen: Soft, non-tender, non-distended.  Umbilicus without lesions.  No hepatomegaly or masses palpable. No evidence of hernia. Genitourinary:  External: Normal external female genitalia. Vulvar varicosities noted. Normal urethral meatus, normal Bartholin's and Skene's glands.    Vagina: Normal vaginal mucosa, anterior prolapse present  Cervix: Grossly normal in appearance, no bleeding, non-tender  Uterus: Anteverted, normal size, shape, and consistency, mobile, and non-tender  Adnexa: No adnexal masses, non-tender  Rectal: deferred  Lymphatic: no evidence of inguinal lymphadenopathy Extremities: no edema, erythema, or  tenderness Neurologic: Grossly intact Psychiatric: mood appropriate, affect full     Assessment: 51 y.o. [id number] well woman exam   Plan:   1) Breast cancer screening - recommend monthly self breast exam and annual screening mammograms. Mammogram is up to date.  2) Colon cancer screen-is being scheduled for colonoscopy thru PCP  3) Cervical cancer screening - Pap not indicated. ASCCP guidelines and rational discussed.  Patient opts for every 3 years screening interval  4) Contraception -no longer needed  5) Routine healthcare maintenance including cholesterol and diabetes screening managed by PCP   6) Osteoporosis prevention: discussed calcium and vitamin D3 requirements. Also recommended weight bearing exercises and given handouts and websites for more info.  Farrel Connersolleen Agatha Duplechain, CNM

## 2017-03-01 LAB — IGP,RFX APTIMA HPV ALL PTH: PAP Smear Comment: 0

## 2017-03-07 ENCOUNTER — Other Ambulatory Visit
Admission: RE | Admit: 2017-03-07 | Discharge: 2017-03-07 | Disposition: A | Discharge status: Home / Self Care | Payer: Managed Care, Other (non HMO) | Source: Ambulatory Visit | Attending: Family Medicine | Admitting: Family Medicine

## 2017-03-07 ENCOUNTER — Inpatient Hospital Stay
Admission: RE | Admit: 2017-03-07 | Discharge: 2017-03-07 | Disposition: A | Discharge status: Home / Self Care | Payer: Self-pay | Source: Ambulatory Visit | Attending: *Deleted | Admitting: *Deleted

## 2017-03-07 ENCOUNTER — Other Ambulatory Visit: Payer: Self-pay | Admitting: *Deleted

## 2017-03-07 DIAGNOSIS — Z9289 Personal history of other medical treatment: Secondary | ICD-10-CM

## 2017-03-07 DIAGNOSIS — R079 Chest pain, unspecified: Secondary | ICD-10-CM | POA: Diagnosis not present

## 2017-03-07 LAB — TROPONIN I

## 2018-01-19 ENCOUNTER — Other Ambulatory Visit: Payer: Self-pay | Admitting: Internal Medicine

## 2018-01-19 DIAGNOSIS — Z1231 Encounter for screening mammogram for malignant neoplasm of breast: Secondary | ICD-10-CM

## 2018-02-10 HISTORY — PX: COLONOSCOPY W/ BIOPSIES AND POLYPECTOMY: SHX1376

## 2018-02-28 ENCOUNTER — Ambulatory Visit: Payer: Managed Care, Other (non HMO) | Admitting: Certified Nurse Midwife

## 2018-03-28 ENCOUNTER — Ambulatory Visit (INDEPENDENT_AMBULATORY_CARE_PROVIDER_SITE_OTHER): Payer: Self-pay | Admitting: Certified Nurse Midwife

## 2018-03-28 ENCOUNTER — Encounter: Payer: Self-pay | Admitting: Certified Nurse Midwife

## 2018-03-28 VITALS — BP 118/80 | HR 97 | Ht 64.0 in | Wt 171.0 lb

## 2018-03-28 DIAGNOSIS — Z01411 Encounter for gynecological examination (general) (routine) with abnormal findings: Secondary | ICD-10-CM

## 2018-03-28 DIAGNOSIS — Z780 Asymptomatic menopausal state: Secondary | ICD-10-CM

## 2018-03-28 DIAGNOSIS — Z1231 Encounter for screening mammogram for malignant neoplasm of breast: Secondary | ICD-10-CM

## 2018-03-28 DIAGNOSIS — Z01419 Encounter for gynecological examination (general) (routine) without abnormal findings: Secondary | ICD-10-CM

## 2018-03-28 DIAGNOSIS — Z8262 Family history of osteoporosis: Secondary | ICD-10-CM

## 2018-03-28 DIAGNOSIS — Z1239 Encounter for other screening for malignant neoplasm of breast: Secondary | ICD-10-CM

## 2018-03-28 DIAGNOSIS — N951 Menopausal and female climacteric states: Secondary | ICD-10-CM

## 2018-03-28 NOTE — Patient Instructions (Signed)
Hot flashes  Remifemin Vitamin E 400 IU twice a day Fosteum (prescription) Estrogen and progesterone Brisdelle         Preventing Osteoporosis, Adult Osteoporosis is a condition that causes the bones to get weaker. With osteoporosis, the bones become thinner, and the normal spaces in bone tissue become larger. This can make the bones weak and cause them to break more easily. People who have osteoporosis are more likely to break their wrist, spine, or hip. Even a minor accident or injury can be enough to break weak bones. Osteoporosis can occur with aging. Your body constantly replaces old bone tissue with new tissue. As you get older, you may lose bone tissue more quickly, or it may be replaced more slowly. Osteoporosis is more likely to develop if you have poor nutrition or do not get enough calcium or vitamin D. Other lifestyle factors can also play a role. By making some diet and lifestyle changes, you can help to keep your bones healthy and help to prevent osteoporosis. What nutrition changes can be made? Nutrition plays an important role in maintaining healthy, strong bones.  Make sure you get enough calcium every day from food or from calcium supplements. - Aim to get 1,000 mg of calcium every day  Try to get enough vitamin D every day. ? If you are age 24 or younger, aim to get 600 international units (IU) every day. ? If you are older than age 12, aim to get 800 international units every day.  Follow a healthy diet. Eat plenty of foods that contain calcium and vitamin D. ? Calcium is in milk, cheese, yogurt, and other dairy products. Some fish and vegetables are also good sources of calcium. Many foods such as cereals and breads have had calcium added to them (are fortified). Check nutrition labels to see how much calcium is in a food or drink. ? Foods that contain vitamin D include milk, cereals, salmon, and tuna. Your body also makes vitamin D when you are out in the sun. Bare  skin exposure to the sun on your face, arms, legs, or back for no more than 30 minutes a day, 2 times per week is more than enough. Beyond that, it is important to use sunblock to protect your skin from sunburn, which increases your risk for skin cancer.  What lifestyle changes can be made? Making changes in your everyday life can also play an important role in preventing osteoporosis.  Stay active and get exercise every day. Ask your health care provider what types of exercise are best for you.  Do not use any products that contain nicotine or tobacco, such as cigarettes and e-cigarettes. If you need help quitting, ask your health care provider.  Limit alcohol intake to no more than 1 drink a day for nonpregnant women and 2 drinks a day for men. One drink equals 12 oz of beer, 5 oz of wine, or 1 oz of hard liquor.  Why are these changes important? Making these nutrition and lifestyle changes can:  Help you develop and maintain healthy, strong bones.  Prevent loss of bone mass and the problems that are caused by that loss, such as broken bones and delayed healing.  Make you feel better mentally and physically.  What can happen if changes are not made? Problems that can result from osteoporosis can be very serious. These may include:  A higher risk of broken bones that are painful and do not heal well.  Physical malformations, such  as a collapsed spine or a hunched back.  Problems with movement.  Where to find support: If you need help making changes to prevent osteoporosis, talk with your health care provider. You can ask for a referral to a diet and nutrition specialist (dietitian) and a physical therapist. Where to find more information: Learn more about osteoporosis from:  NIH Osteoporosis and Related Bone Diseases National Resource Center: www.niams.[employer].chnih.gov/health_info/bone/osteoporosis/osteoporosis_ff.asp  U.S. Office on Women's Health:  S[employer].huwww.womenshealth.gov/publications/our-publications/fact-sheet/osteoporosis.html  National Osteoporosis Foundation: NetsBook.itwww.nof.org/patients/what-is-osteoporosis/  Summary  Osteoporosis is a condition that causes weak bones that are more likely to break.  Eating a healthy diet and making sure you get enough calcium and vitamin D can help prevent osteoporosis.  Other ways to reduce your risk of osteoporosis include getting regular exercise and avoiding alcohol and products that contain nicotine or tobacco. This information is not intended to replace advice given to you by your health care provider. Make sure you discuss any questions you have with your health care provider. Document Released: 08/24/2015 Document Revised: 04/19/2016 Document Reviewed: 04/19/2016 Elsevier Interactive Patient Education  [employer]2018 Elsevier Inc.

## 2018-03-28 NOTE — Progress Notes (Signed)
Gynecology Annual Exam  PCP: Lynnea Ferrier, MD  Chief Complaint:  Chief Complaint  Patient presents with  . Gynecologic Exam    History of Present Illness: Heather Holmes is a 52 y.o. [phone] presents for annual exam. The patient complains of hot flashes and night sweats. She has not had a menses in 2.5 years and is menopausal. Last pap smear: 02/25/17, results were NIL.  The patient is sexually active.  She does not have dyspareunia.  Since her last visit, she has had no significant changes in her health.   Her past medical history is remarkable for arthritis, chronic insomnia, breast augmentation surgery, allergic rhinitis and reactive airway.  The patient does perform some self breast exams. Her last mammogram was taday, results are pending. Her previous mammogram was 02/25/17 and results were negative.   She had a colonoscopy 02/10/2018. Results: 1 polyp. Next colonoscopy in 5 years.  There is no family history of breast cancer.  There is no family history of ovarian cancer.   The patient denies smoking.  She drinks 1-2 glasses of wine a week.  She denies illegal drug use.  The patient does not exercise. She does not get adequate calcium in her diet.   The patient denies current symptoms of depression.    She had a recent cholesterol screen at work  in 2019 and it was normal.  Review of Systems: Review of Systems  Constitutional: Negative for chills, fever and weight loss (intentional).       Positive for weight gain  HENT: Negative for congestion, sinus pain and sore throat.   Eyes: Negative for blurred vision and pain.  Respiratory: Negative for hemoptysis, shortness of breath and wheezing.   Cardiovascular: Negative for chest pain, palpitations and leg swelling.  Gastrointestinal: Negative for abdominal pain, blood in stool, diarrhea, heartburn, nausea and vomiting.  Genitourinary: Negative for dysuria, frequency, hematuria and urgency.  Musculoskeletal:  Positive for joint pain (and swelling). Negative for back pain and myalgias.  Skin: Negative for itching and rash.  Neurological: Positive for weakness (muscle). Negative for dizziness, tingling and headaches.  Endo/Heme/Allergies: Positive for environmental allergies. Negative for polydipsia. Does not bruise/bleed easily.       Negative for hirsutism. Positive for hot flashes, heat intolerance   Psychiatric/Behavioral: Negative for depression. The patient has insomnia. The patient is not nervous/anxious.     Past Medical History:  Past Medical History:  Diagnosis Date  . Anemia   . Anxiety   . Chronic insomnia   . Depression   . Hyperlipidemia   . Kidney infection   . Vitamin B12 deficiency anemia due to intrinsic factor deficiency     Past Surgical History:  Past Surgical History:  Procedure Laterality Date  . AUGMENTATION MAMMAPLASTY Bilateral 2000  . COLONOSCOPY W/ BIOPSIES AND POLYPECTOMY  02/10/2018   Dr Norma Fredrickson  . THYROGLOSSAL DUCT CYST  01/2009  . WISDOM TOOTH EXTRACTION      Family History:  Family History  Problem Relation Age of Onset  . Osteoporosis Mother   . Asthma Father   . COPD Father   . Heart disease Father   . Stroke Father   . Asthma Paternal Grandfather   . Heart disease Paternal Grandfather   . Breast cancer Neg Hx     Social History:  Social History   Socioeconomic History  . Marital status: Married    Spouse name: Molly Maduro  . Number of children: 3  . Years  of education: 16  . Highest education level: Not on file  Occupational History  . Occupation: Airline pilot  Social Needs  . Financial resource strain: Not on file  . Food insecurity:    Worry: Not on file    Inability: Not on file  . Transportation needs:    Medical: Not on file    Non-medical: Not on file  Tobacco Use  . Smoking status: Never Smoker  . Smokeless tobacco: Never Used  Substance and Sexual Activity  . Alcohol use: Yes    Alcohol/week: 1.0 - 2.0 standard drinks     Types: 1 - 2 Glasses of wine per week  . Drug use: No  . Sexual activity: Yes    Partners: Male    Birth control/protection: Post-menopausal  Lifestyle  . Physical activity:    Days per week: Not on file    Minutes per session: Not on file  . Stress: Not on file  Relationships  . Social connections:    Talks on phone: Not on file    Gets together: Not on file    Attends religious service: Not on file    Active member of club or organization: Not on file    Attends meetings of clubs or organizations: Not on file    Relationship status: Not on file  . Intimate partner violence:    Fear of current or ex partner: Not on file    Emotionally abused: Not on file    Physically abused: Not on file    Forced sexual activity: Not on file  Other Topics Concern  . Not on file  Social History Narrative   Attended College at [employer]    Allergies:  Allergies  Allergen Reactions  . Iodinated Diagnostic Agents Swelling    Tongue swelling  . Metrizamide Swelling    Tongue swelling  . Ropinirole Other (See Comments)    Medications:  Current Outpatient Medications on File Prior to Visit  Medication Sig Dispense Refill  . fluticasone (FLONASE) 50 MCG/ACT nasal spray Place into the nose.    . loratadine (CLARITIN) 10 MG tablet Take 10 mg by mouth daily as needed for allergies.    . Naproxen Sodium (ALEVE PO) Take by mouth as needed.    . zolpidem (AMBIEN CR) 6.25 MG CR tablet     . albuterol (PROVENTIL HFA;VENTOLIN HFA) 108 (90 Base) MCG/ACT inhaler Inhale into the lungs.     No current facility-administered medications on file prior to visit.    Physical Exam Vitals: BP 118/80   Pulse 97   Ht 5\' 4"  (1.626 m)   Wt 171 lb (77.6 kg)   BMI 29.35 kg/m   General:White female in  NAD, well groomed, appears stated age HEENT: normocephalic, anicteric Neck: no thyroid enlargement, no palpable nodules, no cervical lymphadenopathy  Pulmonary: No increased work of breathing,  CTAB Cardiovascular: RRR, without murmur  Breast: s/p breast implants, no tenderness, no palpable nodules or masses, no skin or nipple retraction present, no nipple discharge.  No axillary, infraclavicular or supraclavicular lymphadenopathy. Abdomen: Soft, non-tender, non-distended.  Umbilicus without lesions.  No hepatomegaly or masses palpable. No evidence of hernia. Genitourinary:  External: Normal external female genitalia. Vulvar varicosities noted. Normal urethral meatus, normal Bartholin's and Skene's glands.    Vagina: Normal vaginal mucosa, anterior prolapse present  Cervix: Grossly normal in appearance, no bleeding, non-tender  Uterus: Anteverted, normal size, shape, and consistency, mobile, and non-tender  Adnexa: No adnexal masses, non-tender  Rectal: deferred  Lymphatic: no evidence of inguinal lymphadenopathy Extremities: no edema, erythema, or tenderness Neurologic: Grossly intact Psychiatric: mood appropriate, affect full     Assessment: 52 y.o. [id number] well woman exam   Plan:   1) Breast cancer screening - recommend monthly self breast exam and annual screening mammograms. She has her next mammogram scheduled for 04/11/2018  2) Colon cancer screen-next colonoscopy due 2024  3) Cervical cancer screening - Pap not indicated. ASCCP guidelines and rational discussed.  Patient opts for every 3 years screening interval. Next due 2021  4) Contraception -no longer needed  5) Routine healthcare maintenance including cholesterol and diabetes screening managed by PCP   6) Osteoporosis prevention: discussed calcium and vitamin D3 requirements. Also recommended weight bearing exercises and given handouts and websites for more info. Will get DEXA this year given mother's history of osteoporosis  7) Explained options for treatment of vasomotor symptoms, both over the counter and prescription. Discussed pros and cons of each. She would like to try an OTC method at this  time.   Farrel Connersolleen Sharra Cayabyab, CNM

## 2018-04-01 ENCOUNTER — Encounter: Payer: Self-pay | Admitting: Certified Nurse Midwife

## 2018-04-03 ENCOUNTER — Telehealth: Payer: Self-pay | Admitting: Certified Nurse Midwife

## 2018-04-03 NOTE — Telephone Encounter (Signed)
-----   Message from [place] Gutierrez, [place] sent at 04/01/2018  3:18 PM EDT ----- Regarding: schedule bone density [facility] Has mammogram scheduled for 8/20. Can it be done 8/20

## 2018-04-03 NOTE — Telephone Encounter (Signed)
Patient is aware of [facility] on Tuesday, 05/02/18 @ 10:20am. Patient is aware there were no available appointments the same day as her mammogram.

## 2018-04-11 ENCOUNTER — Ambulatory Visit
Admission: RE | Admit: 2018-04-11 | Discharge: 2018-04-11 | Disposition: A | Discharge status: Home / Self Care | Payer: 59 | Source: Ambulatory Visit | Attending: Internal Medicine | Admitting: Internal Medicine

## 2018-04-11 DIAGNOSIS — Z1231 Encounter for screening mammogram for malignant neoplasm of breast: Secondary | ICD-10-CM

## 2018-05-02 ENCOUNTER — Ambulatory Visit
Admission: RE | Admit: 2018-05-02 | Discharge: 2018-05-02 | Disposition: A | Discharge status: Home / Self Care | Payer: 59 | Source: Ambulatory Visit | Attending: Certified Nurse Midwife | Admitting: Certified Nurse Midwife

## 2018-05-02 DIAGNOSIS — Z780 Asymptomatic menopausal state: Secondary | ICD-10-CM

## 2018-05-02 DIAGNOSIS — Z8262 Family history of osteoporosis: Secondary | ICD-10-CM | POA: Insufficient documentation

## 2023-01-04 ENCOUNTER — Other Ambulatory Visit: Payer: Self-pay | Admitting: Internal Medicine

## 2023-01-04 DIAGNOSIS — Z1231 Encounter for screening mammogram for malignant neoplasm of breast: Secondary | ICD-10-CM

## 2023-01-07 ENCOUNTER — Ambulatory Visit
Admission: RE | Admit: 2023-01-07 | Discharge: 2023-01-07 | Disposition: A | Discharge status: Home / Self Care | Payer: BC Managed Care – PPO | Source: Ambulatory Visit | Attending: Internal Medicine | Admitting: Internal Medicine

## 2023-01-07 DIAGNOSIS — Z1231 Encounter for screening mammogram for malignant neoplasm of breast: Secondary | ICD-10-CM | POA: Diagnosis present

## 2023-05-16 ENCOUNTER — Ambulatory Visit: Payer: BC Managed Care – PPO

## 2023-05-16 DIAGNOSIS — Z86010 Personal history of colonic polyps: Secondary | ICD-10-CM | POA: Diagnosis not present

## 2023-05-16 DIAGNOSIS — K5730 Diverticulosis of large intestine without perforation or abscess without bleeding: Secondary | ICD-10-CM | POA: Diagnosis not present

## 2023-05-16 DIAGNOSIS — K640 First degree hemorrhoids: Secondary | ICD-10-CM | POA: Diagnosis not present

## 2023-05-16 DIAGNOSIS — Z09 Encounter for follow-up examination after completed treatment for conditions other than malignant neoplasm: Secondary | ICD-10-CM | POA: Diagnosis present

## 2024-05-17 ENCOUNTER — Other Ambulatory Visit: Payer: Self-pay | Admitting: Internal Medicine

## 2024-05-17 DIAGNOSIS — Z1231 Encounter for screening mammogram for malignant neoplasm of breast: Secondary | ICD-10-CM

## 2024-06-13 ENCOUNTER — Ambulatory Visit
Admission: RE | Admit: 2024-06-13 | Discharge: 2024-06-13 | Disposition: A | Discharge status: Home / Self Care | Source: Ambulatory Visit | Attending: Internal Medicine | Admitting: Internal Medicine

## 2024-06-13 DIAGNOSIS — Z1231 Encounter for screening mammogram for malignant neoplasm of breast: Secondary | ICD-10-CM | POA: Insufficient documentation
